# Patient Record
Sex: Male | Born: 1975 | Race: White | Hispanic: No | Marital: Single | Smoking: Never smoker
Health system: Southern US, Community
[De-identification: ages and names within clinical notes are randomized; demographics above are authoritative.]

## PROBLEM LIST (undated history)

## (undated) HISTORY — PX: MANDIBLE FRACTURE SURGERY: SHX706

---

## 2016-09-09 ENCOUNTER — Emergency Department (HOSPITAL_BASED_OUTPATIENT_CLINIC_OR_DEPARTMENT_OTHER): Payer: BLUE CROSS/BLUE SHIELD

## 2016-09-09 ENCOUNTER — Emergency Department (HOSPITAL_BASED_OUTPATIENT_CLINIC_OR_DEPARTMENT_OTHER)
Admission: EM | Admit: 2016-09-09 | Discharge: 2016-09-09 | Disposition: A | Discharge status: Home / Self Care | Payer: BLUE CROSS/BLUE SHIELD | Attending: Emergency Medicine | Admitting: Emergency Medicine

## 2016-09-09 ENCOUNTER — Encounter (HOSPITAL_BASED_OUTPATIENT_CLINIC_OR_DEPARTMENT_OTHER): Payer: Self-pay | Admitting: Emergency Medicine

## 2016-09-09 DIAGNOSIS — Y929 Unspecified place or not applicable: Secondary | ICD-10-CM | POA: Insufficient documentation

## 2016-09-09 DIAGNOSIS — Y939 Activity, unspecified: Secondary | ICD-10-CM | POA: Insufficient documentation

## 2016-09-09 DIAGNOSIS — W2209XA Striking against other stationary object, initial encounter: Secondary | ICD-10-CM | POA: Diagnosis not present

## 2016-09-09 DIAGNOSIS — Y999 Unspecified external cause status: Secondary | ICD-10-CM | POA: Diagnosis not present

## 2016-09-09 DIAGNOSIS — S60511A Abrasion of right hand, initial encounter: Secondary | ICD-10-CM | POA: Diagnosis not present

## 2016-09-09 DIAGNOSIS — M79641 Pain in right hand: Secondary | ICD-10-CM

## 2016-09-09 DIAGNOSIS — S6991XA Unspecified injury of right wrist, hand and finger(s), initial encounter: Secondary | ICD-10-CM | POA: Diagnosis present

## 2016-09-09 MED ORDER — NAPROXEN 500 MG PO TABS: 500.0000 mg | ORAL_TABLET | Freq: Two times a day (BID) | ORAL | 0 refills | Status: AC

## 2016-09-09 NOTE — ED Provider Notes (Signed)
[facility]   CSN: [id number] Arrival date & time: 09/09/16  0901     History   Chief Complaint Chief Complaint  Patient presents with  . Hand Pain    HPI Daniel Stein is a 40 y.o. male.  Patient presents today with right hand pain.  Pain has been present since last evening when he punched a wooden wall.  Pain located primarily over the 3rd metacarpal.  Pain worse with palpation.  He has not taken anything for pain prior to arrival.  He denies numbness or tingling.  He is right handed.  He did sustain abrasions to the 3rd-5th MCP.  He reports tetanus is UTD.      History reviewed. No pertinent past medical history.  There are no active problems to display for this patient.   History reviewed. No pertinent surgical history.     Home Medications    Prior to Admission medications   Not on File    Family History History reviewed. No pertinent family history.  Social History Social History  Substance Use Topics  . Smoking status: Never Smoker  . Smokeless tobacco: Never Used  . Alcohol use 1.8 oz/week    3 Cans of beer per week     Comment: occ     Allergies   Review of patient's allergies indicates no known allergies.   Review of Systems Review of Systems  All other systems reviewed and are negative.    Physical Exam Updated Vital Signs BP 132/91 (BP Location: Left Arm)   Pulse 100   Temp 98.2 F (36.8 C) (Oral)   Resp 18   Ht 5\' 8"  (1.727 m)   Wt 74.8 kg   SpO2 100%   BMI 25.09 kg/m   Physical Exam  Constitutional: He appears well-developed and well-nourished.  HENT:  Head: Normocephalic and atraumatic.  Neck: Normal range of motion.  Cardiovascular: Normal rate, regular rhythm and normal heart sounds.   Pulses:      Radial pulses are 2+ on the right side, and 2+ on the left side.  Pulmonary/Chest: Effort normal and breath sounds normal.  Musculoskeletal: Normal range of motion.  Full ROM of all fingers of  the right hand Tenderness to palpation of the 3rd metacarpal.  No obvious deformity Mild diffuse swelling of the dorsal aspect of the right hand  Neurological: He is alert.  Distal sensation of all fingers of the right hand are intact  Skin: Skin is warm and dry.  Small superficial abrasions to the 3rd-5th MCP of the right hand Good capillary refill of the fingers of the right hand  Nursing note and vitals reviewed.    ED Treatments / Results  Labs (all labs ordered are listed, but only abnormal results are displayed) Labs Reviewed - No data to display  EKG  EKG Interpretation None       Radiology Dg Hand Complete Right  Result Date: 09/09/2016 CLINICAL DATA:  Punched wall, right hand pain EXAM: RIGHT HAND - COMPLETE 3+ VIEW COMPARISON:  None. FINDINGS: No fracture or dislocation is seen. Possible erosion along the radial base the second proximal phalanx. The joint spaces are preserved. Visualized soft tissues are within normal limits. IMPRESSION: No fracture or dislocation is seen. Electronically Signed   By: Charline Bills M.D.   On: 09/09/2016 09:28    Procedures Procedures (including critical care time)  Medications Ordered in ED Medications - No data to display   Initial Impression / Assessment and  Plan / ED Course  I have reviewed the triage vital signs and the nursing notes.  Pertinent labs & imaging results that were available during my care of the patient were reviewed by me and considered in my medical decision making (see chart for details).  Clinical Course    Patient presents today with right hand pain after punching a wooden wall last evening.  Xray is negative.  Neurovascularly intact.  No signs of infection.  Feel that the patient is stable for discharge.  Return precautions given.  Final Clinical Impressions(s) / ED Diagnoses   Final diagnoses:  None    New Prescriptions New Prescriptions   No medications on file     Santiago GladHeather Alyson Ki,  PA-C 09/09/16 1325    Loren Raceravid Yelverton, MD 09/09/16 1452

## 2016-09-09 NOTE — ED Triage Notes (Signed)
Patient reports that he is having pain to his right hand since last night when he punched a wall

## 2017-08-07 ENCOUNTER — Encounter (HOSPITAL_BASED_OUTPATIENT_CLINIC_OR_DEPARTMENT_OTHER): Payer: Self-pay | Admitting: Emergency Medicine

## 2017-08-07 ENCOUNTER — Emergency Department (HOSPITAL_BASED_OUTPATIENT_CLINIC_OR_DEPARTMENT_OTHER)
Admission: EM | Admit: 2017-08-07 | Discharge: 2017-08-07 | Disposition: A | Discharge status: Home / Self Care | Payer: BLUE CROSS/BLUE SHIELD | Attending: Emergency Medicine | Admitting: Emergency Medicine

## 2017-08-07 DIAGNOSIS — R197 Diarrhea, unspecified: Secondary | ICD-10-CM | POA: Diagnosis not present

## 2017-08-07 NOTE — ED Provider Notes (Signed)
[facility]   CSN: [id number] Arrival date & time: 08/07/17  4098     History   Chief Complaint Chief Complaint  Patient presents with  . Diarrhea    HPI Daniel Stein is a 41 y.o. male.  Patient is a 41 yo M who presents to ED with diarrhea and abdominal cramping. States started 3 days ago after returning from vacation, does remember eating some shrimp tacos. Has had continuous b/l lower abdominal cramping and diarrhea with 10+ BMs each day. Not much appetite but drinking plenty of water. No nausea or vomiting. No blood in stool. No sick contacts. No fever/chills.      History reviewed. No pertinent past medical history.  There are no active problems to display for this patient.   Past Surgical History:  Procedure Laterality Date  . MANDIBLE FRACTURE SURGERY         Home Medications    Prior to Admission medications   Medication Sig Start Date End Date Taking? Authorizing Provider  clobetasol (TEMOVATE) 0.05 % external solution Apply 1 application topically 2 (two) times daily.   Yes [provider]  naproxen (NAPROSYN) 500 MG tablet Take 1 tablet (500 mg total) by mouth 2 (two) times daily. 09/09/16   Santiago Glad, PA-C    Family History No family history on file.  Social History Social History  Substance Use Topics  . Smoking status: Never Smoker  . Smokeless tobacco: Never Used  . Alcohol use 1.8 oz/week    3 Cans of beer per week     Comment: occ     Allergies   Patient has no known allergies.   Review of Systems Review of Systems  Constitutional: Positive for fatigue. Negative for chills and fever.  HENT: Negative for congestion, rhinorrhea and sore throat.   Respiratory: Negative for cough and shortness of breath.   Cardiovascular: Negative for chest pain.  Gastrointestinal: Positive for abdominal distention, abdominal pain and diarrhea. Negative for constipation, nausea and vomiting.  Genitourinary:  Negative for dysuria, frequency, hematuria and urgency.  Neurological: Negative for headaches.     Physical Exam Updated Vital Signs BP 121/85 (BP Location: Right Arm)   Pulse (!) 106   Temp 98 F (36.7 C) (Oral)   Resp 20   Ht 5\' 8"  (1.727 m)   Wt 74.8 kg (165 lb)   SpO2 100%   BMI 25.09 kg/m   Physical Exam  Constitutional: He is oriented to person, place, and time. He appears well-developed and well-nourished. No distress.  HENT:  Head: Normocephalic and atraumatic.  Nose: Nose normal.  Mouth/Throat: Oropharynx is clear and moist.  Eyes: Pupils are equal, round, and reactive to light. Conjunctivae and EOM are normal.  Neck: Normal range of motion. Neck supple.  Cardiovascular: Normal rate, regular rhythm, normal heart sounds and intact distal pulses.   No murmur heard. Pulmonary/Chest: Effort normal and breath sounds normal. No respiratory distress. He has no wheezes.  Abdominal: Soft. Bowel sounds are normal. He exhibits no distension and no mass. There is tenderness (mild TTP diffusely). There is no rebound and no guarding.  Musculoskeletal: Normal range of motion. He exhibits no edema.  Lymphadenopathy:    He has no cervical adenopathy.  Neurological: He is alert and oriented to person, place, and time.  Skin: Skin is warm and dry. Capillary refill takes less than 2 seconds. No rash noted.  Psychiatric: He has a normal mood and affect.     ED Treatments /  Results  Labs (all labs ordered are listed, but only abnormal results are displayed) Labs Reviewed - No data to display  EKG  EKG Interpretation None       Radiology No results found.  Procedures Procedures (including critical care time)  Medications Ordered in ED Medications - No data to display   Initial Impression / Assessment and Plan / ED Course  I have reviewed the triage vital signs and the nursing notes.  Pertinent labs & imaging results that were available during my care of the patient  were reviewed by me and considered in my medical decision making (see chart for details).   Patient is a 41 yo M who presented to ED with diarrhea and abdominal cramping for 3 days. Vitals stable and well appearing on exam with good hydration status. No red flags on history. Suspect likely infectious gastroenteritis vs food borne. Regardless patient reassured and discussed symptomatic management at home.   Final Clinical Impressions(s) / ED Diagnoses   Final diagnoses:  Diarrhea of presumed infectious origin    New Prescriptions New Prescriptions   No medications on file     Leland Her, DO 08/07/17 0865    Rolland Porter, MD 08/15/17 2324

## 2017-08-07 NOTE — Discharge Instructions (Signed)
Please stay well hydrated at home with things like water and gatorade. I suspect that things should start to get better in the next few days. Make sure everyone in your family is washing their hands with soap and water. Please follow up with your primary care doctor as outpatient as needed.

## 2017-08-07 NOTE — ED Provider Notes (Signed)
Pt seen and evaluated. D/W Dr. Artist Pais. Pt with 3 days of diarrhea. No red flag symptoms or history. Hydrating well. Takes Doxy prn/intermittent for skin--none for 3 weeks. Nobdlood/dysentary. No travel. Taking large quantities V-8 juice. Cautioned against large solute loads.simple clears discussed. RTER with changes discussed.   Rolland Porter, MD 08/07/17 [phone]

## 2017-08-07 NOTE — ED Triage Notes (Signed)
Diarrhea since Tuesday with abd cramping with stools. Denies n/v or abd pain at present.

## 2017-11-12 IMAGING — CR DG HAND COMPLETE 3+V*R*
3 series · 3 of 3 positions shown · non-contrast
Comparison: None.

CLINICAL DATA: Punched wall, right hand pain

EXAM:
RIGHT HAND - COMPLETE 3+ VIEW

[x hand pa right]
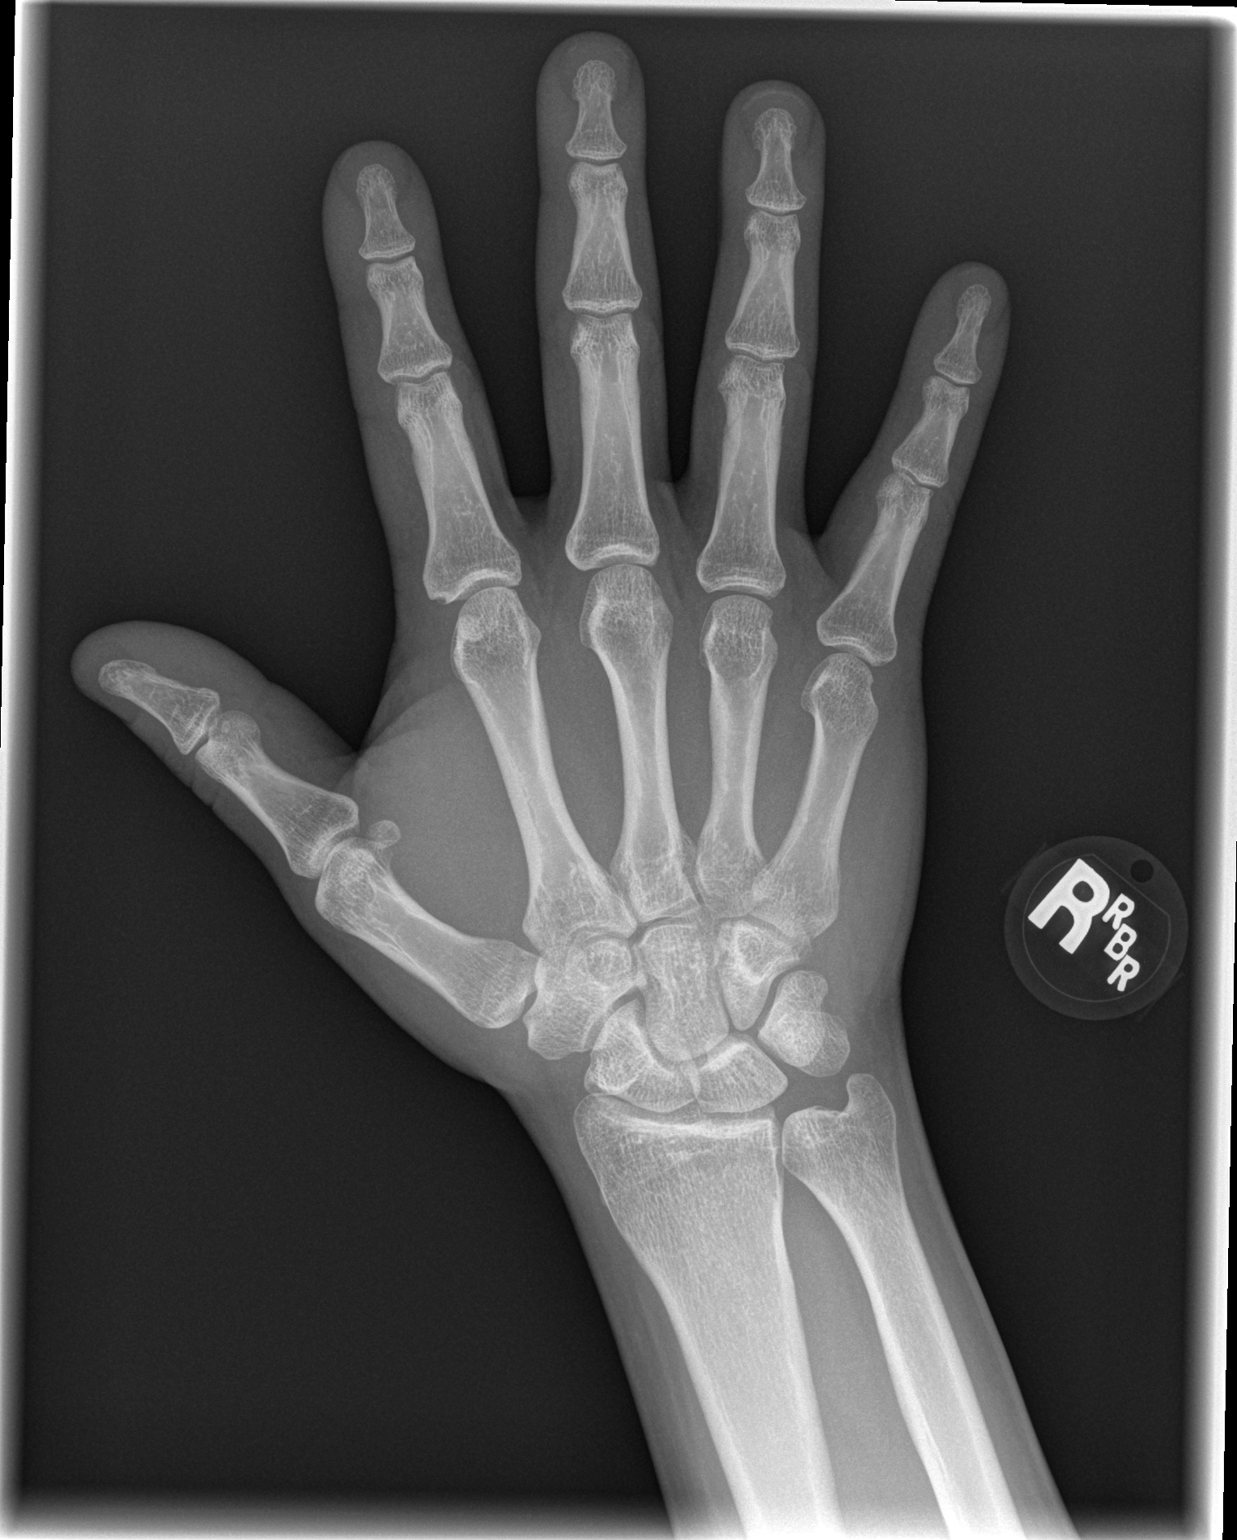

[x hand oblique right]
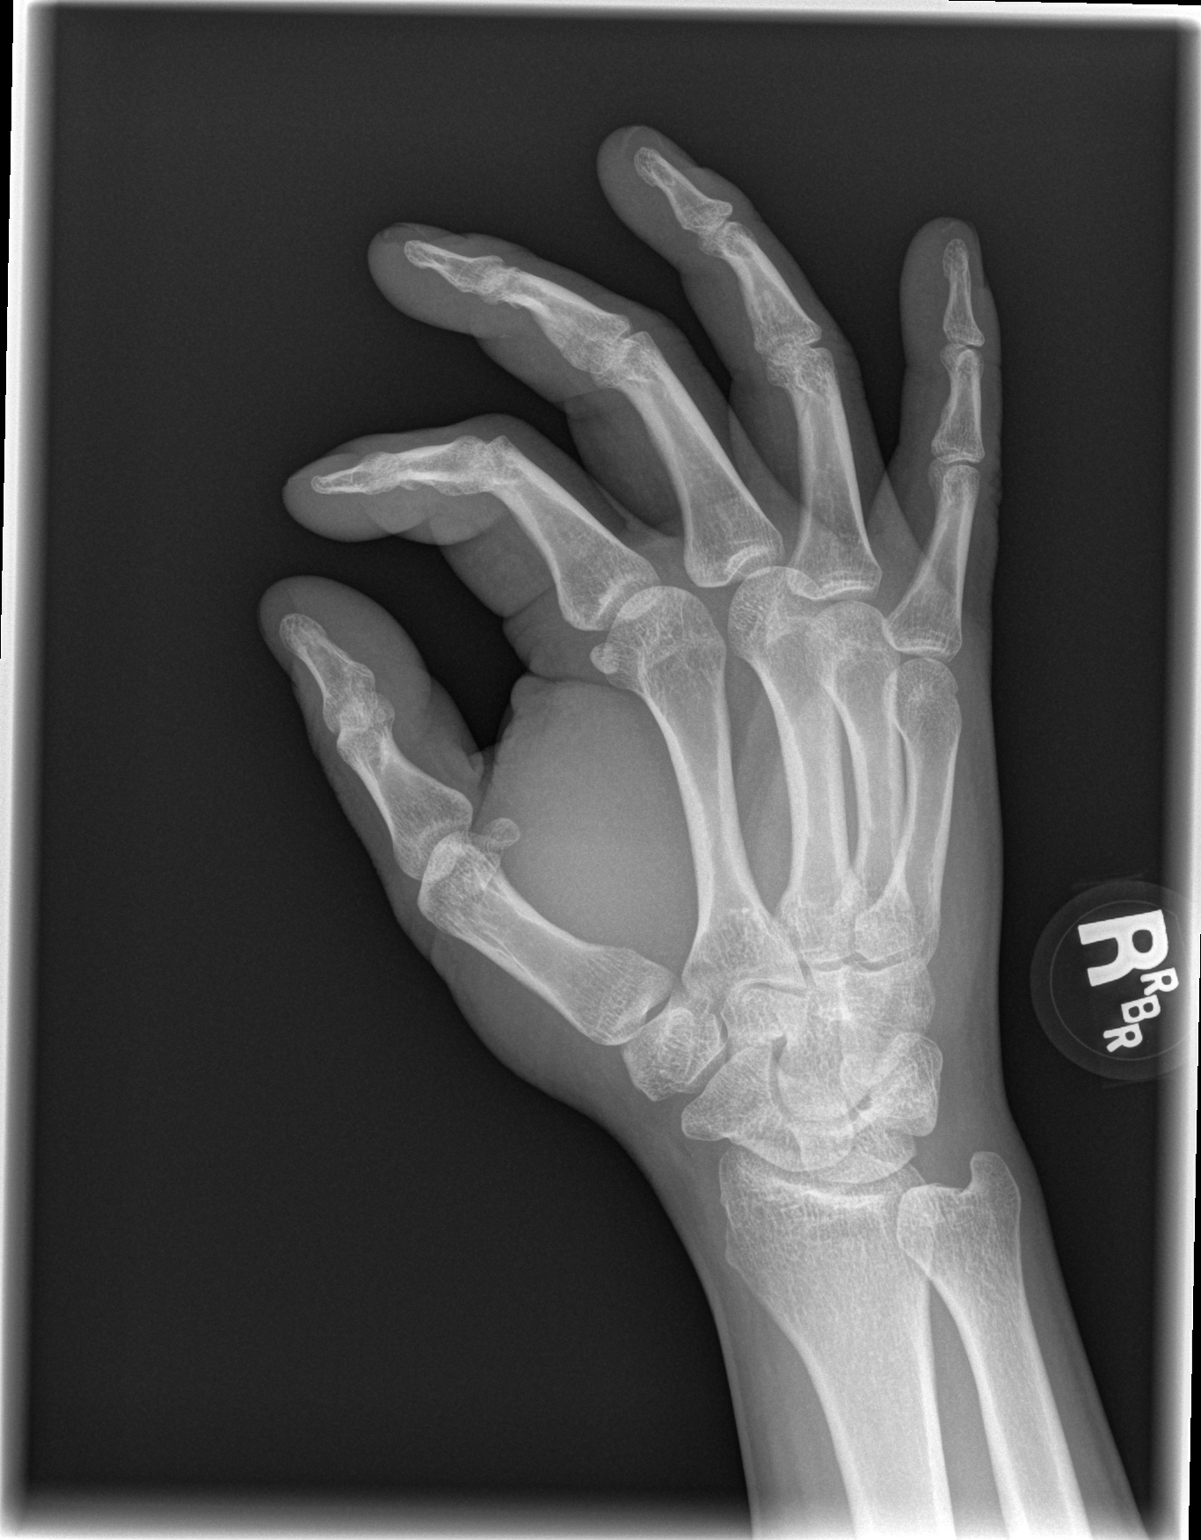

[x hand lat right]
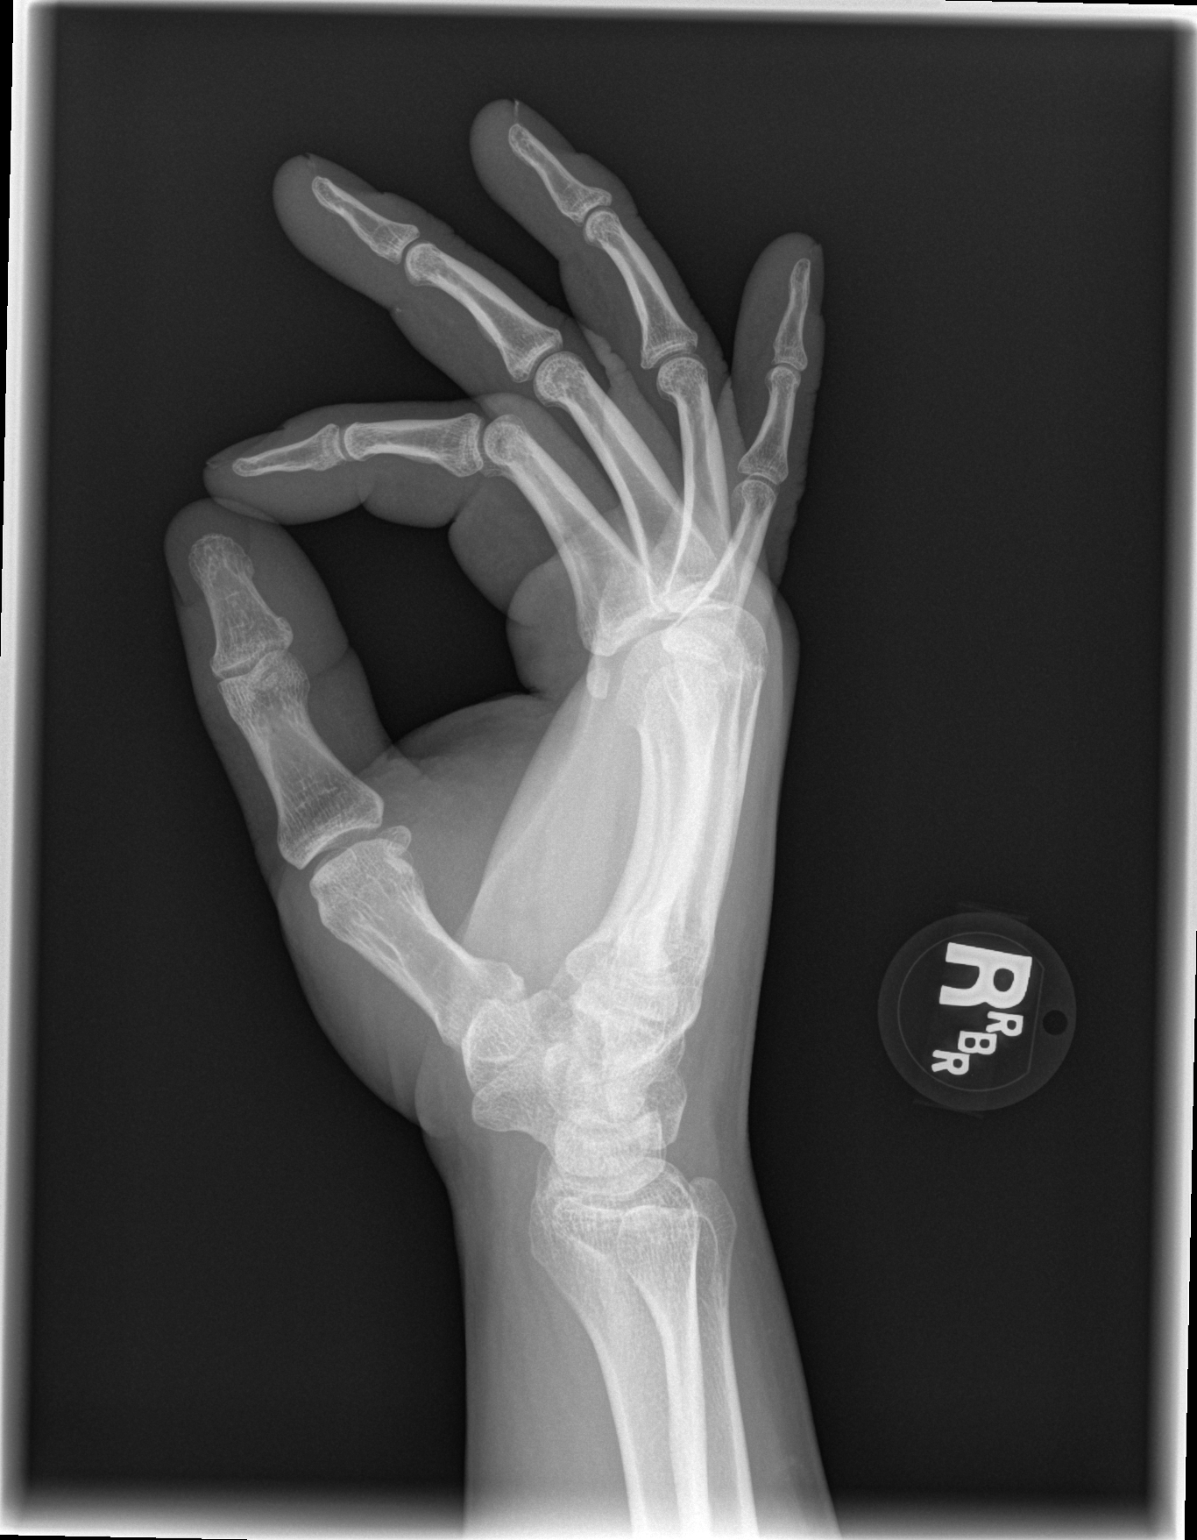

[3 of 3 positions shown; findings below may reference images not displayed]

FINDINGS: No fracture or dislocation is seen.

Possible erosion along the radial base the second proximal phalanx.

The joint spaces are preserved.

Visualized soft tissues are within normal limits.
IMPRESSION: No fracture or dislocation is seen.

## 2021-03-17 ENCOUNTER — Encounter (HOSPITAL_BASED_OUTPATIENT_CLINIC_OR_DEPARTMENT_OTHER): Payer: Self-pay

## 2021-03-17 ENCOUNTER — Emergency Department (HOSPITAL_BASED_OUTPATIENT_CLINIC_OR_DEPARTMENT_OTHER)
Admission: EM | Admit: 2021-03-17 | Discharge: 2021-03-17 | Disposition: A | Discharge status: Home / Self Care | Payer: BC Managed Care – PPO | Attending: Emergency Medicine | Admitting: Emergency Medicine

## 2021-03-17 ENCOUNTER — Other Ambulatory Visit: Payer: Self-pay

## 2021-03-17 DIAGNOSIS — W268XXA Contact with other sharp object(s), not elsewhere classified, initial encounter: Secondary | ICD-10-CM | POA: Insufficient documentation

## 2021-03-17 DIAGNOSIS — S61313A Laceration without foreign body of left middle finger with damage to nail, initial encounter: Secondary | ICD-10-CM | POA: Insufficient documentation

## 2021-03-17 DIAGNOSIS — Y92000 Kitchen of unspecified non-institutional (private) residence as  the place of occurrence of the external cause: Secondary | ICD-10-CM | POA: Diagnosis not present

## 2021-03-17 DIAGNOSIS — Y990 Civilian activity done for income or pay: Secondary | ICD-10-CM | POA: Diagnosis not present

## 2021-03-17 DIAGNOSIS — S6992XA Unspecified injury of left wrist, hand and finger(s), initial encounter: Secondary | ICD-10-CM

## 2021-03-17 MED ORDER — OXYCODONE HCL 5 MG PO TABS: 5.0000 mg | ORAL_TABLET | Freq: Four times a day (QID) | ORAL | 0 refills | Status: AC | PRN

## 2021-03-17 MED ORDER — BUPIVACAINE HCL (PF) 0.5 % IJ SOLN
10.0000 mL | Freq: Once | INTRAMUSCULAR | Status: AC
  Administered 2021-03-17: 10 mL
  Filled 2021-03-17: qty 10

## 2021-03-17 NOTE — ED Triage Notes (Signed)
Lac to L middle finger at approx 1500 today.  Tetanus unknown, bleeding controlled in triage.  Pt describes being cut by a shard metal tool.

## 2021-03-17 NOTE — Discharge Instructions (Addendum)
Please take Ibuprofen (Advil, motrin) and Tylenol (acetaminophen) to relieve your pain.    You may take up to 600 MG (3 pills) of normal strength ibuprofen every 8 hours as needed.   You make take tylenol, up to 1,000 mg (two extra strength pills) every 8 hours as needed.   It is safe to take ibuprofen and tylenol at the same time as they work differently.   Do not take more than 3,000 mg tylenol in a 24 hour period (not more than one dose every 8 hours.  Please check all medication labels as many medications such as pain and cold medications may contain tylenol.  Do not drink alcohol while taking these medications.  Do not take other NSAID'S while taking ibuprofen (such as aleve or naproxen).  Please take ibuprofen with food to decrease stomach upset.  You are being prescribed a medication which may make you sleepy. For 24 hours after one dose please do not drive, operate heavy machinery, care for a small child with out another adult present, or perform any activities that may cause harm to you or someone else if you were to fall asleep or be impaired.

## 2021-03-17 NOTE — ED Provider Notes (Signed)
[facility] EMERGENCY DEPARTMENT Provider Note   CSN: [id number] Arrival date & time: 03/17/21  1514     History Chief Complaint  Patient presents with  . Finger Injury    Daniel Stein is a 45 y.o. male with no pertinent past medical history, last tetanus shot in 2018 who presents today for evaluation of his left middle finger injury. He is right-hand dominant. He states that shortly prior to arrival he was working on cabinets in a kitchen and while using a planer injured his left long finger. He denies any coagulopathy.  He has no interventions tried prior to arrival for the pain.  Patient denies concern for foreign body. We discussed role of imaging including x-rays and evaluation of foreign bodies and he declined. HPI     History reviewed. No pertinent past medical history.  There are no problems to display for this patient.   Past Surgical History:  Procedure Laterality Date  . MANDIBLE FRACTURE SURGERY         History reviewed. No pertinent family history.  Social History   Tobacco Use  . Smoking status: Never Smoker  . Smokeless tobacco: Never Used  Substance Use Topics  . Alcohol use: Yes    Alcohol/week: 3.0 standard drinks    Types: 3 Cans of beer per week    Comment: occ  . Drug use: No    Home Medications Prior to Admission medications   Medication Sig Start Date End Date Taking? Authorizing Provider  oxyCODONE (ROXICODONE) 5 MG immediate release tablet Take 1 tablet (5 mg total) by mouth every 6 (six) hours as needed for severe pain. 03/17/21  Yes Cristina Gong, PA-C  clobetasol (TEMOVATE) 0.05 % external solution Apply 1 application topically 2 (two) times daily.    [provider]  naproxen (NAPROSYN) 500 MG tablet Take 1 tablet (500 mg total) by mouth 2 (two) times daily. 09/09/16   Santiago Glad, PA-C    Allergies    Patient has no known allergies.  Review of Systems   Review of Systems  Constitutional:  Negative for chills and fever.  Skin: Positive for wound. Negative for color change.  Neurological: Negative for weakness.  All other systems reviewed and are negative.   Physical Exam Updated Vital Signs BP 132/88 (BP Location: Right Arm)   Pulse 98   Temp 98.4 F (36.9 C) (Oral)   Resp 18   Ht 5\' 9"  (1.753 m)   Wt 83.9 kg   SpO2 99%   BMI 27.32 kg/m   Physical Exam Vitals and nursing note reviewed.  Constitutional:      General: He is not in acute distress.    Appearance: He is not diaphoretic.  HENT:     Head: Normocephalic and atraumatic.  Eyes:     General: No scleral icterus.       Right eye: No discharge.        Left eye: No discharge.     Conjunctiva/sclera: Conjunctivae normal.  Cardiovascular:     Rate and Rhythm: Normal rate and regular rhythm.     Comments: Distal tip of left long finger has brisk capillary refill, oozing from the wound bed. Pulmonary:     Effort: Pulmonary effort is normal. No respiratory distress.     Breath sounds: No stridor.  Abdominal:     General: There is no distension.  Musculoskeletal:        General: No deformity.     Cervical back: Normal range  of motion.  Skin:    General: Skin is warm and dry.     Comments: Please see clinical image.  The pad of the left long finger over the distal phalanx is avulsed.  This does not appear to fully extend through the dermis.  There is scant risk of bleeding.  Neurological:     Mental Status: He is alert.     Motor: No abnormal muscle tone.     Comments: Sensation intact to light touch to distal left long finger  Psychiatric:        Behavior: Behavior normal.     ED Results / Procedures / Treatments   Labs (all labs ordered are listed, but only abnormal results are displayed) Labs Reviewed - No data to display  EKG None  Radiology No results found.  Procedures Irrigation and debridement  Date/Time: 03/17/2021 5:19 PM Performed by: Cristina Gong, PA-C Authorized by:  Cristina Gong, PA-C  Consent: Verbal consent obtained. Risks and benefits: risks, benefits and alternatives were discussed Consent given by: patient Local anesthesia used: yes Anesthesia: digital block  Anesthesia: Local anesthesia used: yes Local Anesthetic: bupivacaine 0.5% without epinephrine Anesthetic total: 3 mL  Sedation: Patient sedated: no  Patient tolerance: patient tolerated the procedure well with no immediate complications Comments: Devitalized avulsed skin was fully removed and skin edges were neatened to allow better healing.       Medications Ordered in ED Medications  bupivacaine (MARCAINE) 0.5 % injection 10 mL (10 mLs Infiltration Given by Other 03/17/21 1556)    ED Course  I have reviewed the triage vital signs and the nursing notes.  Pertinent labs & imaging results that were available during my care of the patient were reviewed by me and considered in my medical decision making (see chart for details).  Clinical Course as of 03/17/21 1722  [name] Mar 17, 2021  1605 Digital block placed [EH]    Clinical Course User Index [EH] Daniel Stein   MDM Rules/Calculators/A&P                         Patient is a 45 year old man who presents today for evaluation of a avulsion injury to his left long finger.  He is right-hand dominant.  Digital block was placed to allow for exploration and irrigation. Wound was cleaned by staff prior to my evaluation with Betadine and wound cleanser along with sterile saline.  I additionally irrigated with saline. He had removal of devitalized avulsed skin. Wound visualized to the base of the wound bed without visualized foreign bodies. I did discuss with him the role of x-rays for evaluation of retained foreign bodies which patient declined. Discussed role of antibiotics.  Given that the devitalized skin is removed and it is a avulsion without indication for primary closure, and given the amount of bleeding that  he had with him being young, healthy, not immunosuppressed and not diabetic for now we will hold on antibiotics.  He is given hand follow-up.  [facility] consulted and he is given a short course of oxycodone IR, due to Vicodin shortage, for breakthrough pain not controlled by ibuprofen and Tylenol.  Return precautions were discussed with patient who states their understanding.  At the time of discharge patient denied any unaddressed complaints or concerns.  Patient is agreeable for discharge home.  Note: Portions of this report may have been transcribed using voice recognition software. Every effort was made to ensure accuracy;  however, inadvertent computerized transcription errors may be present  Final Clinical Impression(s) / ED Diagnoses Final diagnoses:  Injury of finger of left hand, initial encounter    Rx / DC Orders ED Discharge Orders         Ordered    oxyCODONE (ROXICODONE) 5 MG immediate release tablet  Every 6 hours PRN        03/17/21 1656           Cristina Gong, Cordelia Poche 03/17/21 1722    Alvira Monday, MD 03/18/21 [phone]

## 2021-03-17 NOTE — ED Notes (Signed)
ED Provider at bedside.
